# Patient Record
Sex: Male | Born: 2000
Health system: Southern US, Community
[De-identification: ages and names within clinical notes are randomized; demographics above are authoritative.]

---
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
## 2019-02-08 ENCOUNTER — Telehealth: Payer: Self-pay | Admitting: Adult Health

## 2019-02-08 NOTE — Telephone Encounter (Signed)
Patient's mom called states he had a bad seizure over the weekend & she wants to know if she should just take him to a Neurologist or wait for referral from [place] her that Valetta Fuller is on vacation this week & that I would forward message to medical assistant for review & to contact her at [phone].  --glh

## 2019-02-09 NOTE — Telephone Encounter (Signed)
Spoke with pt's mother who states that pt did not have any jerking motions at the time, but did "pass out".  She states that she was able to "slap" pt awake after a very short period of time (seconds).  Advised pt's mother that this doesn't sound like a true seizure and that pt needs OV to evaluate episode.  Pt's mother expressed understanding and is agreeable.  Also advised mother that if pt has another episode prior to OV, she should call EMS and have pt transported to ED for evaluation.  Mother transferred to front desk to schedule appt.

## 2019-02-15 NOTE — Progress Notes (Signed)
Subjective:    Patient ID: Zachary Stanton, male    DOB: 2001-02-16, 18 y.o.   MRN: [id number]  HPI: 12/21/2018 OV: Zachary Stanton is here to establish as a new pt.  He is a pleasant 18 year old male. PMH: He denies chronic medical conditions  He reports excellent sleep  He estimates to drink >60 oz water and day and tries to keep fast food to 1-2 times/week. When not injures- he exercise's frequently: Weight Training and running 3/4 times week >1 hour He denies tobacco/vape/ETOH/illicit drug use He reports his father had MI in his early 29s He reports that is father was a heavy smoker at time of [place] Accident Accident  11/12/2018 Followed by Dr. Alvira Monday at [facility] for bilateral knee pain Reviewed notes at bedside. He has not followed up as directed, due to high co-pay. Advised him to send pictures and message via MyChart as directed, assisted in taking pictures today. R knee wound is healing well, no active signs of infection. Pt reports being able to ambulate without crutches, however unable to run. He has been riding his dirt bike for limited periods. He will complete course Cephalexin (Keflex) 500mg  four times/day. He denies GI upset.  Mother at [facility] during [place]  02/15/2019 OV: Zachary Stanton presents with recent near syncopal event that occured 9 days ago. He reports coming downstairs to dinner Sunday evening. He experienced sudden/intense lower abdominal pain that he occasionally will experience prior to [facility] He has hx of constipation. He reports BMs every other day typically, but will become acutely constipated "from time to time". Denies hematochezia/hematouria He went to restroom- was unable to pass stool. He returned to dinner table and pain developed, causing him "loss a few seconds", however he denies LOC. He reports full awareness during event. He denies hx of seizures. He reports similar episode a few months ago, similar GI complaint prior to  near-syncopal event.  He denies known family Neurological hx He reports father has had MI  He reports using vape products daily, marijuana every 3 days, denies ETOH use  He drinks >75 oz water/day, is quite active  He denies any acute sx's today He denies any CP/dyspnea/palpitations at time of near-syncopal events  EKG- SB, Rightward Axis, Incomplete RBBB, ST Elevation-consider early repolarization, pericarditis, or injury No other tracings to compare to   Patient Care Team    Relationship Specialty Notifications Start End  Esaw Grandchild, NP PCP - General Family Medicine  12/21/18     Patient Active Problem List   Diagnosis Date Noted  . Near syncope 02/16/2019  . Healthcare maintenance 12/21/2018  . Right knee injury, sequela 12/21/2018     History reviewed. No pertinent past medical history.   History reviewed. No pertinent surgical history.   Family History  Problem Relation Age of Onset  . Hyperlipidemia Mother   . Hypertension Mother   . Hyperlipidemia Father   . Hypertension Father   . Heart attack Father   . Diabetes Maternal Aunt   . Diabetes Maternal Uncle   . Diabetes Maternal Grandmother   . Diabetes Paternal Grandmother      Social History   Substance and Sexual Activity  Drug Use Never     Social History   Substance and Sexual Activity  Alcohol Use Never  . Frequency: Never     Social History   Tobacco Use  Smoking Status Never Smoker  Smokeless Tobacco Never Used  Outpatient Encounter Medications as of 02/16/2019  Medication Sig  . [DISCONTINUED] cephALEXin (KEFLEX) 500 MG capsule Take 1 capsule (500 mg total) by mouth 4 (four) times daily.   No facility-administered encounter medications on file as of 02/16/2019.     Allergies: Patient has no known allergies.  Body mass index is 21.64 kg/m.  Blood pressure 124/69, pulse (!) 47, temperature 98.1 F (36.7 C), temperature source Oral, height 5\' 10"  (1.778 m), weight  150 lb 12.8 oz (68.4 kg), SpO2 99 %.  Review of Systems  Constitutional: Positive for fatigue. Negative for activity change, appetite change, chills, diaphoresis, fever and unexpected weight change.  HENT: Negative for congestion.   Eyes: Negative for visual disturbance.  Respiratory: Negative for cough, chest tightness, shortness of breath, wheezing and stridor.   Cardiovascular: Negative for chest pain, palpitations and leg swelling.  Gastrointestinal: Negative for abdominal distention, anal bleeding, blood in stool, constipation, diarrhea, nausea and vomiting.  Endocrine: Negative for cold intolerance, heat intolerance, polydipsia, polyphagia and polyuria.  Genitourinary: Negative for difficulty urinating, flank pain and hematuria.  Neurological: Negative for dizziness and headaches.  Hematological: Negative for adenopathy. Does not bruise/bleed easily.       Objective:   Physical Exam Constitutional:      General: He is not in acute distress.    Appearance: Normal appearance. He is normal weight. He is not ill-appearing, toxic-appearing or diaphoretic.  Eyes:     Extraocular Movements: Extraocular movements intact.     Conjunctiva/sclera: Conjunctivae normal.     Pupils: Pupils are equal, round, and reactive to light.  Cardiovascular:     Rate and Rhythm: Bradycardia present.     Pulses: Normal pulses.     Heart sounds: Normal heart sounds. No murmur. No friction rub. No gallop.   Pulmonary:     Effort: Pulmonary effort is normal. No respiratory distress.     Breath sounds: Normal breath sounds. No stridor. No wheezing, rhonchi or rales.  Chest:     Chest wall: No tenderness.  Abdominal:     General: Abdomen is flat. Bowel sounds are normal. There is no distension.     Palpations: Abdomen is soft. There is no mass.     Tenderness: There is no abdominal tenderness. There is no right CVA tenderness, left CVA tenderness, guarding or rebound.     Hernia: No hernia is present.   Skin:    General: Skin is warm and dry.     Capillary Refill: Capillary refill takes less than 2 seconds.  Neurological:     Mental Status: He is alert and oriented to person, place, and time.  Psychiatric:        Mood and Affect: Mood normal.        Behavior: Behavior normal.        Thought Content: Thought content normal.        Judgment: Judgment normal.       Assessment & Plan:   1. Bradycardia   2. Syncope, unspecified syncope type   3. Near syncope     Near syncope Possible vaso-vagal response due to constipation vs cardiac in nature Increase water, eat a diet rich in fruits/vegetables/fiber- to promote regular bowel movements. Do not smoke anything- vape, etc Do not over-exert yourself until you are evaluated by Cardiology- urgent referral placed. We will call you when your lab results are available. Continue to social distance and wear a mask when in public.    FOLLOW-UP:  Return in about 3  months (around 05/19/2019) for Regular Follow Up.

## 2019-02-16 ENCOUNTER — Ambulatory Visit (INDEPENDENT_AMBULATORY_CARE_PROVIDER_SITE_OTHER): Payer: BC Managed Care – PPO | Admitting: Adult Health

## 2019-02-16 ENCOUNTER — Other Ambulatory Visit: Payer: Self-pay

## 2019-02-16 ENCOUNTER — Encounter: Payer: Self-pay | Admitting: Adult Health

## 2019-02-16 VITALS — BP 124/69 | HR 47 | Temp 98.1°F | Ht 70.0 in | Wt 150.8 lb

## 2019-02-16 DIAGNOSIS — R001 Bradycardia, unspecified: Secondary | ICD-10-CM

## 2019-02-16 DIAGNOSIS — R55 Syncope and collapse: Secondary | ICD-10-CM | POA: Diagnosis not present

## 2019-02-16 NOTE — Patient Instructions (Signed)
Near-Syncope Near-syncope is when you suddenly get weak or dizzy, or you feel like you might pass out (faint). This may also be called presyncope. This is due to a lack of blood flow to the brain. During an episode of near-syncope, you may:  Feel dizzy, weak, or light-headed.  Feel sick to your stomach (nauseous).  See all white or all black.  See spots.  Have cold, clammy skin. This condition is caused by a sudden decrease in blood flow to the brain. This decrease can result from various causes, but most of those causes are not dangerous. However, near-syncope may be a sign of a serious medical problem, so it is important to seek medical care. Follow these instructions at home: Medicines  Take over-the-counter and prescription medicines only as told by your doctor.  If you are taking blood pressure or heart medicine, get up slowly and spend many minutes getting ready to sit and then stand. This can help with dizziness. General instructions  Be aware of any changes in your symptoms.  Talk with your doctor about your symptoms. You may need to have testing to find the cause of your near-syncope.  If you start to feel like you might pass out, lie down right away. Raise (elevate) your feet above the level of your heart. Breathe deeply and steadily. Wait until all of the symptoms are gone.  Have someone stay with you until you feel stable.  Do not drive, use machinery, or play sports until your doctor says it is okay.  Drink enough fluid to keep your pee (urine) pale yellow.  Keep all follow-up visits as told by your doctor. This is important. Get help right away if you:  Have a seizure.  Have pain in your: ? Chest. ? Belly (abdomen). ? Back.  Faint once or more than once.  Have a very bad headache.  Are bleeding from your mouth or butt.  Have black or tarry poop (stool).  Have a very fast or uneven heartbeat (palpitations).  Are mixed up (confused).  Have trouble  walking.  Are very weak.  Have trouble seeing. These symptoms may be an emergency. Do not wait to see if the symptoms will go away. Get medical help right away. Call your local emergency services (911 in the [place] Do not drive yourself to the hospital. Summary  Near-syncope is when you suddenly get weak or dizzy, or you feel like you might pass out (faint).  This condition is caused by a lack of blood flow to the brain.  Near-syncope may be a sign of a serious medical problem, so it is important to seek medical care. This information is not intended to replace advice given to you by your health care provider. Make sure you discuss any questions you have with your health care provider. Document Released: 12/11/2007 Document Revised: 10/16/2018 Document Reviewed: 05/13/2018 Elsevier Patient Education  2020 [place].   Increase water, eat a diet rich in fruits/vegetables/fiber- to promote regular bowel movements. Do not smoke anything- vape, etc Do not over-exert yourself until you are evaluated by Cardiology- urgent referral placed. We will call you when your lab results are available. Continue to social distance and wear a mask when in public. Follow-up here in 3 months.

## 2019-02-16 NOTE — Assessment & Plan Note (Addendum)
Possible vaso-vagal response due to constipation vs cardiac in nature Increase water, eat a diet rich in fruits/vegetables/fiber- to promote regular bowel movements. Do not smoke anything- vape, etc Do not over-exert yourself until you are evaluated by Cardiology- urgent referral placed. We will call you when your lab results are available. Continue to social distance and wear a mask when in public.

## 2019-02-17 LAB — CBC WITH DIFFERENTIAL/PLATELET
Basophils Absolute: 0.1 10*3/uL (ref 0.0–0.2)
Basos: 1 %
EOS (ABSOLUTE): 0.2 10*3/uL (ref 0.0–0.4)
Eos: 4 %
Hematocrit: 46.4 % (ref 37.5–51.0)
Hemoglobin: 15.2 g/dL (ref 13.0–17.7)
Immature Grans (Abs): 0 10*3/uL (ref 0.0–0.1)
Immature Granulocytes: 0 %
Lymphocytes Absolute: 1.4 10*3/uL (ref 0.7–3.1)
Lymphs: 27 %
MCH: 28.8 pg (ref 26.6–33.0)
MCHC: 32.8 g/dL (ref 31.5–35.7)
MCV: 88 fL (ref 79–97)
Monocytes Absolute: 0.6 10*3/uL (ref 0.1–0.9)
Monocytes: 11 %
Neutrophils Absolute: 3 10*3/uL (ref 1.4–7.0)
Neutrophils: 57 %
Platelets: 273 10*3/uL (ref 150–450)
RBC: 5.27 x10E6/uL (ref 4.14–5.80)
RDW: 12.5 % (ref 11.6–15.4)
WBC: 5.2 10*3/uL (ref 3.4–10.8)

## 2019-02-17 LAB — COMPREHENSIVE METABOLIC PANEL
ALT: 19 IU/L (ref 0–44)
AST: 23 IU/L (ref 0–40)
Albumin/Globulin Ratio: 1.7 (ref 1.2–2.2)
Albumin: 4.6 g/dL (ref 4.1–5.2)
Alkaline Phosphatase: 81 IU/L (ref 56–127)
BUN/Creatinine Ratio: 13 (ref 9–20)
BUN: 14 mg/dL (ref 6–20)
Bilirubin Total: 0.6 mg/dL (ref 0.0–1.2)
CO2: 23 mmol/L (ref 20–29)
Calcium: 9.8 mg/dL (ref 8.7–10.2)
Chloride: 99 mmol/L (ref 96–106)
Creatinine, Ser: 1.09 mg/dL (ref 0.76–1.27)
GFR calc Af Amer: 114 mL/min/{1.73_m2} (ref >59)
GFR calc non Af Amer: 99 mL/min/{1.73_m2} (ref >59)
Globulin, Total: 2.7 g/dL (ref 1.5–4.5)
Glucose: 84 mg/dL (ref 65–99)
Potassium: 5.1 mmol/L (ref 3.5–5.2)
Sodium: 138 mmol/L (ref 134–144)
Total Protein: 7.3 g/dL (ref 6.0–8.5)

## 2019-02-17 LAB — TSH: TSH: 1.29 u[IU]/mL (ref 0.450–4.500)

## 2019-02-17 LAB — T3: T3, Total: 161 ng/dL (ref 71–180)

## 2019-02-17 LAB — T4, FREE: Free T4: 1.38 ng/dL (ref 0.93–1.60)
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
## 2019-03-17 ENCOUNTER — Ambulatory Visit (INDEPENDENT_AMBULATORY_CARE_PROVIDER_SITE_OTHER): Payer: BC Managed Care – PPO | Admitting: Cardiovascular Disease

## 2019-03-17 ENCOUNTER — Encounter: Payer: Self-pay | Admitting: Cardiovascular Disease

## 2019-03-17 ENCOUNTER — Other Ambulatory Visit: Payer: Self-pay

## 2019-03-17 VITALS — BP 117/62 | HR 39 | Ht 72.0 in | Wt 146.0 lb

## 2019-03-17 DIAGNOSIS — R55 Syncope and collapse: Secondary | ICD-10-CM

## 2019-03-17 NOTE — Patient Instructions (Addendum)
Hydrate, After trauma, lay flat After BM, watch out, stay flat, Salt load, raise body weight, load  Compression hose, back brace There are pills  (florinef,midodrine)   Medication Instructions:  No changes  If you need a refill on your cardiac medications before your next appointment, please call your pharmacy.    Lab work: No new labs needed   If you have labs (blood work) drawn today and your tests are completely normal, you will receive your results only by: Marland Kitchen MyChart Message (if you have MyChart) OR . A paper copy in the mail If you have any lab test that is abnormal or we need to change your treatment, we will call you to review the results.   Testing/Procedures: No new testing needed   Follow-Up: At [facility], you and your health needs are our priority.  As part of our continuing mission to provide you with exceptional heart care, we have created designated Provider Care Teams.  These Care Teams include your primary Cardiologist (physician) and Advanced Practice Providers (APPs -  Physician Assistants and Nurse Practitioners) who all work together to provide you with the care you need, when you need it.  . You will need a follow up appointment as needed   . Providers on your designated Care Team:   . Murray Hodgkins, NP . Christell Faith, PA-C . Marrianne Mood, PA-C  Any Other Special Instructions Will Be Listed Below (If Applicable).  For educational health videos Log in to : [email or web address] Or : [email or web address], password : [email or web address]

## 2019-04-05 ENCOUNTER — Ambulatory Visit: Payer: BC Managed Care – PPO | Admitting: Cardiovascular Disease

## 2019-04-05 ENCOUNTER — Encounter
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
## 2019-06-24 ENCOUNTER — Other Ambulatory Visit: Payer: Self-pay

## 2019-06-24 ENCOUNTER — Encounter: Payer: Self-pay | Admitting: Adult Health

## 2019-06-24 ENCOUNTER — Ambulatory Visit (INDEPENDENT_AMBULATORY_CARE_PROVIDER_SITE_OTHER): Payer: BC Managed Care – PPO | Admitting: Adult Health

## 2019-06-24 VITALS — BP 139/54 | HR 47 | Temp 98.7°F | Ht 71.0 in | Wt 151.8 lb

## 2019-06-24 DIAGNOSIS — Z23 Encounter for immunization: Secondary | ICD-10-CM | POA: Diagnosis not present

## 2019-06-24 DIAGNOSIS — Z7251 High risk heterosexual behavior: Secondary | ICD-10-CM

## 2019-06-24 DIAGNOSIS — R55 Syncope and collapse: Secondary | ICD-10-CM | POA: Diagnosis not present

## 2019-06-24 DIAGNOSIS — Z0000 Encounter for general adult medical examination without abnormal findings: Secondary | ICD-10-CM | POA: Diagnosis not present

## 2019-06-24 NOTE — Patient Instructions (Addendum)

## 2019-06-24 NOTE — Assessment & Plan Note (Signed)
  Remain well hydrated, continue to eat every few hours. Continue to reduce to stop vape use- you can do it! Follow-up with cardiology as needed. Continue to social distance and wear a mask when in public. Recommend annual physical.

## 2019-06-24 NOTE — Assessment & Plan Note (Signed)
Resolved with improved hydration and regular eating. F/u with cardiology as needed.

## 2019-06-26 LAB — GC/CHLAMYDIA PROBE AMP
Chlamydia trachomatis, NAA: NEGATIVE
Neisseria Gonorrhoeae by PCR: NEGATIVE
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
## 2020-08-10 ENCOUNTER — Telehealth: Payer: Self-pay | Admitting: Physician Assistant

## 2020-08-10 NOTE — Telephone Encounter (Signed)
Spoke with Dois Davenport and advised pt to go to [facility] for eval and treatment since we do not have apts available in our office this week. AS, CMA

## 2020-08-10 NOTE — Telephone Encounter (Signed)
Patient's son came home from work with a sore throat, low grade fever, and some body aches. I advised patient's mother that we did not have anything available this week. His mother Dois Davenport called and can be reached at [phone], thanks.

## 2020-08-11 ENCOUNTER — Ambulatory Visit: Payer: Self-pay

## 2024-07-15 ENCOUNTER — Ambulatory Visit (INDEPENDENT_AMBULATORY_CARE_PROVIDER_SITE_OTHER): Admitting: Family Medicine

## 2024-07-15 ENCOUNTER — Encounter: Payer: Self-pay | Admitting: Family Medicine

## 2024-07-15 VITALS — BP 138/78 | HR 65 | Ht 73.0 in | Wt 172.0 lb

## 2024-07-15 DIAGNOSIS — Z7689 Persons encountering health services in other specified circumstances: Secondary | ICD-10-CM | POA: Insufficient documentation

## 2024-07-15 DIAGNOSIS — R55 Syncope and collapse: Secondary | ICD-10-CM

## 2024-07-15 NOTE — Assessment & Plan Note (Addendum)
 Recurrent episodes likely due to parasympathetic overreaction triggered by pain. Current episodes non-life-threatening but risk injury from falls. Symptoms and triggers suggest vasovagal etiology over cardiac syncope. - Ordered EKG to assess for changes since 2020.- no changes. Nothing concerning noted.  - Ordered labs to rule out cortisol and thyroid issues. - Referred to physical therapist for syncope prevention maneuvers. - Advised increasing salt intake and fluid consumption to maintain blood pressure - Discussed potential use of SSRIs post-lab results.

## 2024-07-15 NOTE — Patient Instructions (Signed)
 It was nice to see you today,  We addressed the following topics today: - I will let you know your results when you get them.  - I will send in a referral to resolve physical therapy.  Someone will call you to schedule an appt.   Have a great day,  Rolan Slain, MD
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
## 2024-07-16 ENCOUNTER — Ambulatory Visit: Payer: Self-pay | Admitting: Family Medicine

## 2024-07-16 LAB — CBC WITH DIFFERENTIAL/PLATELET
Basophils Absolute: 0 x10E3/uL (ref 0.0–0.2)
Basos: 1 %
EOS (ABSOLUTE): 0.2 x10E3/uL (ref 0.0–0.4)
Eos: 3 %
Hematocrit: 44.9 % (ref 37.5–51.0)
Hemoglobin: 15.1 g/dL (ref 13.0–17.7)
Immature Grans (Abs): 0 x10E3/uL (ref 0.0–0.1)
Immature Granulocytes: 0 %
Lymphocytes Absolute: 0.7 x10E3/uL (ref 0.7–3.1)
Lymphs: 13 %
MCH: 29.9 pg (ref 26.6–33.0)
MCHC: 33.6 g/dL (ref 31.5–35.7)
MCV: 89 fL (ref 79–97)
Monocytes Absolute: 0.8 x10E3/uL (ref 0.1–0.9)
Monocytes: 15 %
Neutrophils Absolute: 3.6 x10E3/uL (ref 1.4–7.0)
Neutrophils: 68 %
Platelets: 263 x10E3/uL (ref 150–450)
RBC: 5.05 x10E6/uL (ref 4.14–5.80)
RDW: 12.4 % (ref 11.6–15.4)
WBC: 5.2 x10E3/uL (ref 3.4–10.8)

## 2024-07-16 LAB — TSH: TSH: 1.64 u[IU]/mL (ref 0.450–4.500)

## 2024-07-16 LAB — COMPREHENSIVE METABOLIC PANEL WITH GFR
ALT: 25 IU/L (ref 0–44)
AST: 25 IU/L (ref 0–40)
Albumin: 4.6 g/dL (ref 4.3–5.2)
Alkaline Phosphatase: 60 IU/L (ref 47–123)
BUN/Creatinine Ratio: 17 (ref 9–20)
BUN: 17 mg/dL (ref 6–20)
Bilirubin Total: 0.5 mg/dL (ref 0.0–1.2)
CO2: 23 mmol/L (ref 20–29)
Calcium: 9.4 mg/dL (ref 8.7–10.2)
Chloride: 101 mmol/L (ref 96–106)
Creatinine, Ser: 1.02 mg/dL (ref 0.76–1.27)
Globulin, Total: 3 g/dL (ref 1.5–4.5)
Glucose: 88 mg/dL (ref 70–99)
Potassium: 4.7 mmol/L (ref 3.5–5.2)
Sodium: 138 mmol/L (ref 134–144)
Total Protein: 7.6 g/dL (ref 6.0–8.5)
eGFR: 105 mL/min/1.73

## 2024-07-16 LAB — CORTISOL: Cortisol: 17.2 ug/dL (ref 6.2–19.4)

## 2024-07-16 NOTE — Progress Notes (Signed)
Called pt he is advised of his lab results 

## 2024-10-13 ENCOUNTER — Ambulatory Visit: Admitting: Family Medicine
